# Patient Record
Sex: Male | Born: 1985 | Race: White | Hispanic: No | Marital: Single
Health system: Southern US, Community
[De-identification: ages and names within clinical notes are randomized; demographics above are authoritative.]

---

## 2010-08-10 ENCOUNTER — Inpatient Hospital Stay: Payer: Self-pay | Admitting: Internal Medicine

## 2011-06-20 ENCOUNTER — Emergency Department: Payer: Self-pay | Admitting: *Deleted

## 2013-04-24 ENCOUNTER — Emergency Department: Payer: Self-pay | Admitting: Emergency Medicine

## 2013-04-24 LAB — COMPREHENSIVE METABOLIC PANEL
Albumin: 4.1 g/dL (ref 3.4–5.0)
Bilirubin,Total: 0.5 mg/dL (ref 0.2–1.0)
Calcium, Total: 9.7 mg/dL (ref 8.5–10.1)
Chloride: 101 mmol/L (ref 98–107)
Creatinine: 1.19 mg/dL (ref 0.60–1.30)
EGFR (African American): >60
Glucose: 145 mg/dL — ABNORMAL HIGH (ref 65–99)
Osmolality: 278 (ref 275–301)
Potassium: 3.4 mmol/L — ABNORMAL LOW (ref 3.5–5.1)
Sodium: 138 mmol/L (ref 136–145)
Total Protein: 7.6 g/dL (ref 6.4–8.2)

## 2013-04-24 LAB — DRUG SCREEN, URINE
Amphetamines, Ur Screen: NEGATIVE (ref ?–1000)
Benzodiazepine, Ur Scrn: NEGATIVE (ref ?–200)
Cannabinoid 50 Ng, Ur ~~LOC~~: NEGATIVE (ref ?–50)
Cocaine Metabolite,Ur ~~LOC~~: NEGATIVE (ref ?–300)
MDMA (Ecstasy)Ur Screen: NEGATIVE (ref ?–500)
Methadone, Ur Screen: NEGATIVE (ref ?–300)
Phencyclidine (PCP) Ur S: NEGATIVE (ref ?–25)

## 2013-04-24 LAB — URINALYSIS, COMPLETE
Bilirubin,UR: NEGATIVE
Glucose,UR: >=500 mg/dL (ref 0–75)
Leukocyte Esterase: NEGATIVE
Nitrite: NEGATIVE
RBC,UR: 1908 /HPF (ref 0–5)
WBC UR: 3 /HPF (ref 0–5)

## 2013-04-24 LAB — CBC
HGB: 16 g/dL (ref 13.0–18.0)
MCV: 89 fL (ref 80–100)
Platelet: 367 10*3/uL (ref 150–440)
RBC: 5.32 10*6/uL (ref 4.40–5.90)
RDW: 12 % (ref 11.5–14.5)

## 2013-06-01 ENCOUNTER — Ambulatory Visit: Payer: Self-pay | Admitting: Urology

## 2014-10-19 NOTE — Consult Note (Signed)
PATIENT NAME:  Carl Wise, Carl Wise DATE OF BIRTH:  [birth year]  DATE OF CONSULTATION:  04/24/2013  CONSULTING PHYSICIAN:  Adah Salvageichard E. Excell Seltzerooper, MD  CHIEF COMPLAINT: Abdominal pain.   HISTORY OF PRESENT ILLNESS: This is a patient with 1 day of abdominal pain that started last night and was worsening. He had nausea and vomiting, multiple emeses, but at present his pain is completely resolved. He has never had an episode like this before. Denies fevers or chills. He has had a prior appendectomy. He points to the suprapubic region as the area of prior complaints, but he states that has completely resolved. He denies hematuria.   PAST MEDICAL HISTORY: None.   PAST SURGICAL HISTORY: Appendectomy.   ALLERGIES: None.   MEDICATIONS: None.   FAMILY HISTORY: Noncontributory.   SOCIAL HISTORY: The patient does not smoke.   REVIEW OF SYSTEMS:  Ten system review was performed and negative with the exception of that mentioned in the HPI.   PHYSICAL EXAMINATION: GENERAL: Comfortable-appearing male patient.  VITAL SIGNS: Low-grade temp of 99.6, pulse 118, now 103. Blood pressure 109/71. Pain scale of 0, 97% room air sat.  HEENT:  Shows no scleral icterus.  NECK: No palpable neck nodes.  CHEST: Clear to auscultation.  CARDIAC: Regular rate and rhythm.  ABDOMEN: Soft, nondistended, nontympanitic and essentially nontender.  EXTREMITIES: Without edema.  NEUROLOGIC: Grossly intact.  INTEGUMENT:  Shows no jaundice.   White blood cell count is slightly elevated at 11.7. H and H are 16 and 47.   CT scan is personally reviewed.   Discussed with Dr. Carollee MassedKaminski. Currently, the patient is pain free. He has no further nausea or vomiting. He has received fluids, and his heart rate has come down. I see no surgical needs at this point, and will be available if needed. Likely, this patient who is quite reliable, could be discharged to follow up with his primary care physician, should this worsen. He does  have known kidney stones found on a CT scan.    ____________________________ Adah Salvageichard E. Excell Seltzerooper, MD rec:dmm D: 04/24/2013 12:42:36 ET T: 04/24/2013 12:53:27 ET JOB#: [id number]  cc: Adah Salvageichard E. Excell Seltzerooper, MD, <Dictator> Lattie HawICHARD E COOPER MD ELECTRONICALLY SIGNED 04/24/2013 16:56

## 2014-10-19 NOTE — Consult Note (Signed)
Brief Consult Note: Diagnosis: abd pain.   Patient was seen by consultant.   Consult note dictated.   Recommend further assessment or treatment.   Discussed with Attending MD.   Comments: abd [pain and n/v resolved   HR 100 discussed with ER MD. No treatment necessary. Had prior appendectomy. known KS.  Electronic Signatures: Lattie Hawooper, Karisa Nesser E (MD)  (Signed 27-Oct-14 12:39)  Authored: Brief Consult Note   Last Updated: 27-Oct-14 12:39 by Lattie Hawooper, Eliyanah Elgersma E (MD)

## 2015-07-14 IMAGING — CT CT ABD-PELV W/ CM
1 of 2 series · 15 of 32 positions shown, 19 images · non-contrast
Comparison: none

REASON FOR EXAM: (1) abd pain - lower abd/pelvis, hematuria, tachycardia
after IV's; (2) abd pain
COMMENTS:

PROCEDURE:     CT  - CT ABDOMEN / PELVIS  W  - April 24, 2013  [DATE]
RESULT:     CT abdomen and pelvis dated 04/24/2013
TECHNIQUE: Helical 3 mm sections were obtained from the lung bases through
the pubic symphysis status post intravenous ministration of 100 mL of
[id number].

[Series 2: 3mm soft tissue · axial · 0.68mm/px · z∈[-436,+8]mm · 15 of 162 slices shown, 19 images]
[im 7/162  soft-tissue]
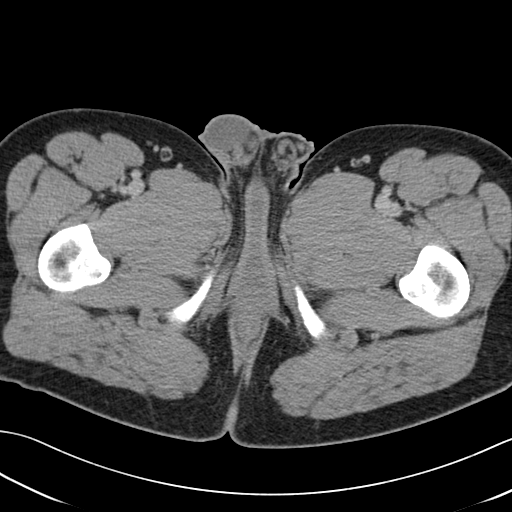
[im 7/162  bone]
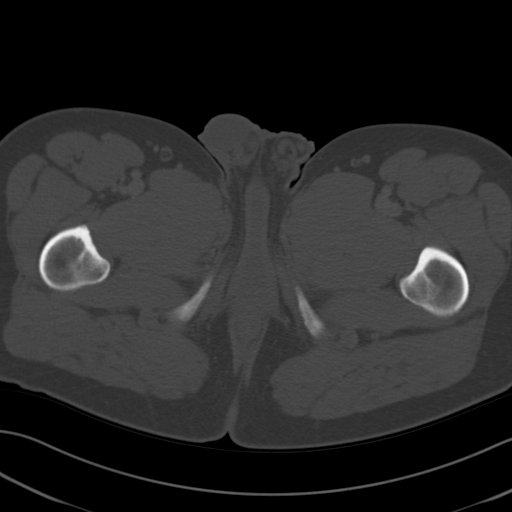
[im 20/162  soft-tissue]
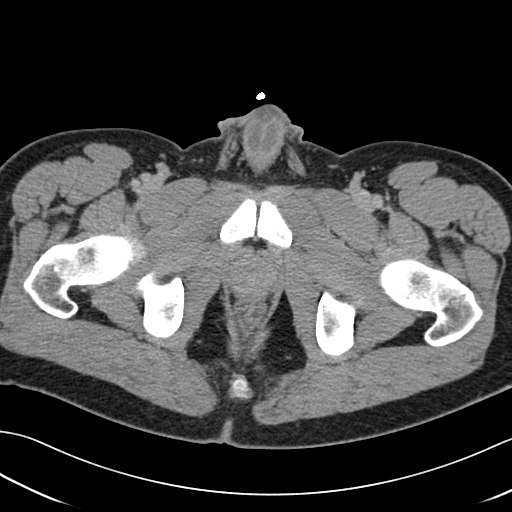
[im 33/162  soft-tissue]
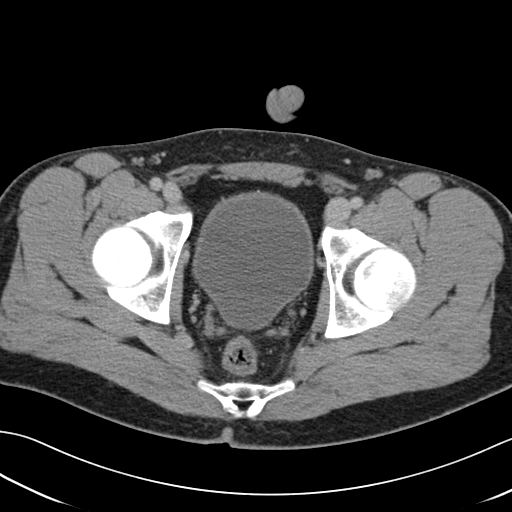
[im 46/162  soft-tissue]
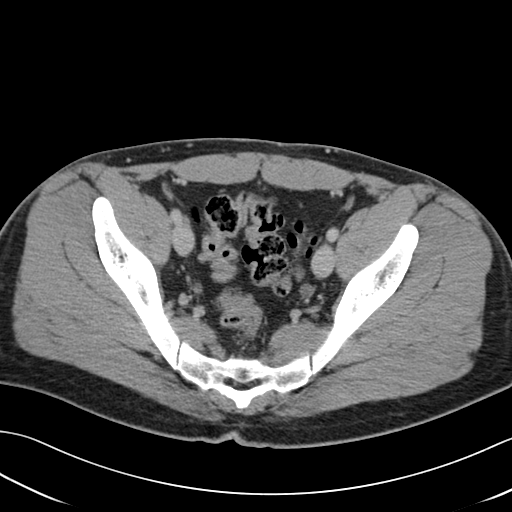
[im 58/162  soft-tissue]
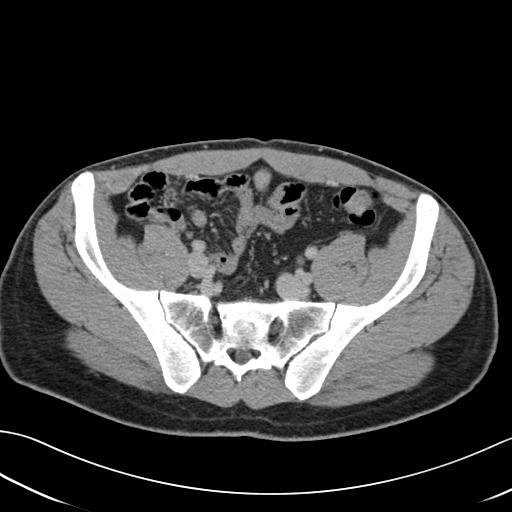
[im 71/162  soft-tissue]
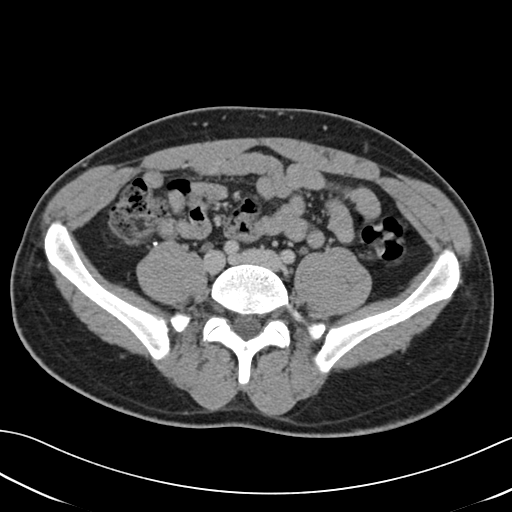
[im 84/162  soft-tissue]
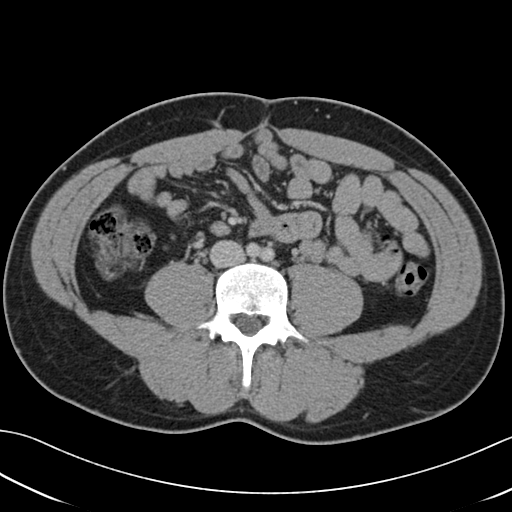
[im 91/162  soft-tissue]
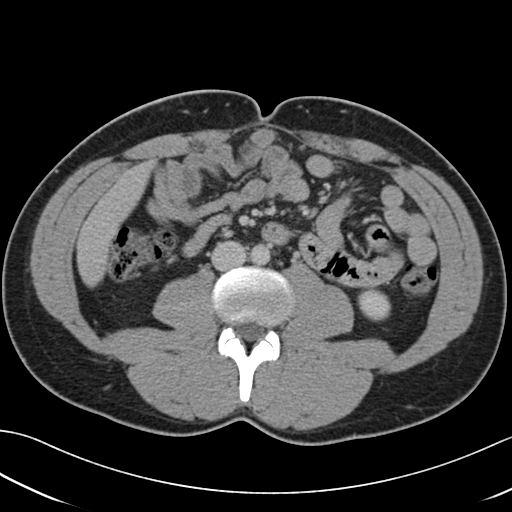
[im 104/162  soft-tissue]
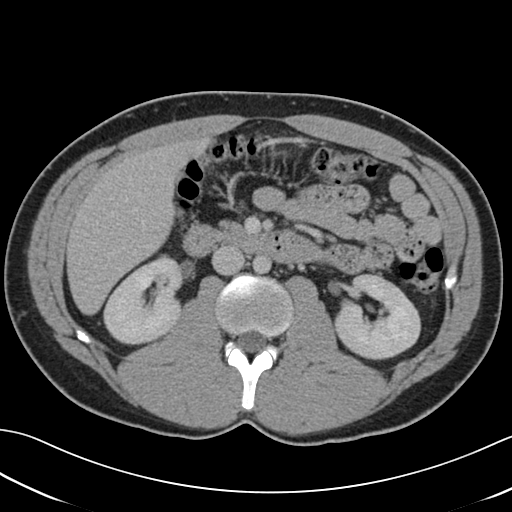
[im 104/162  bone]
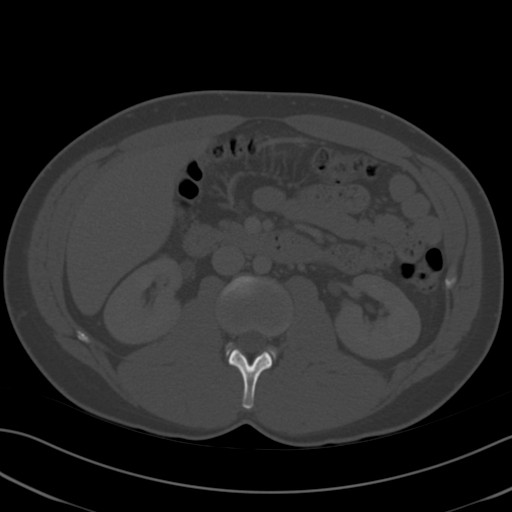
[im 116/162  soft-tissue]
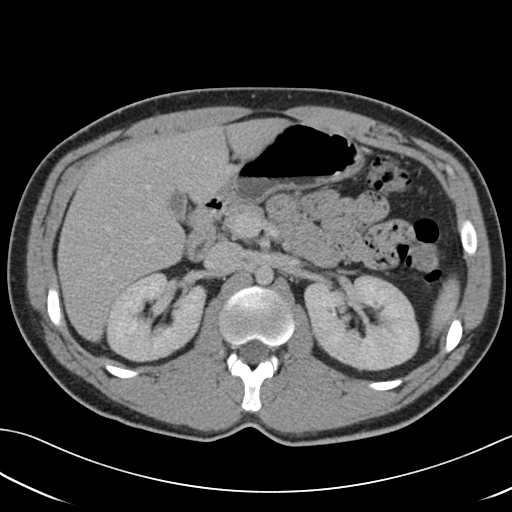
[im 129/162  soft-tissue]
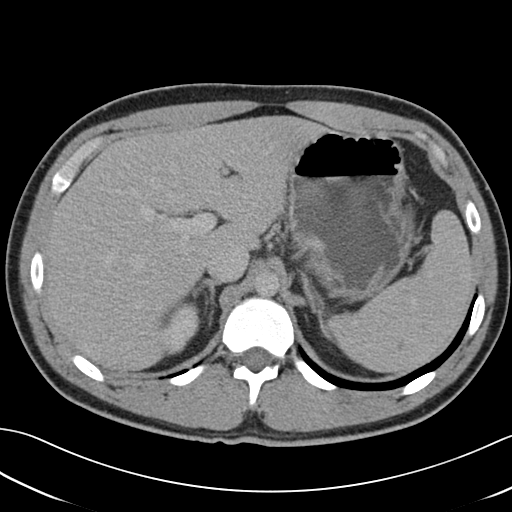
[im 136/162  lung]
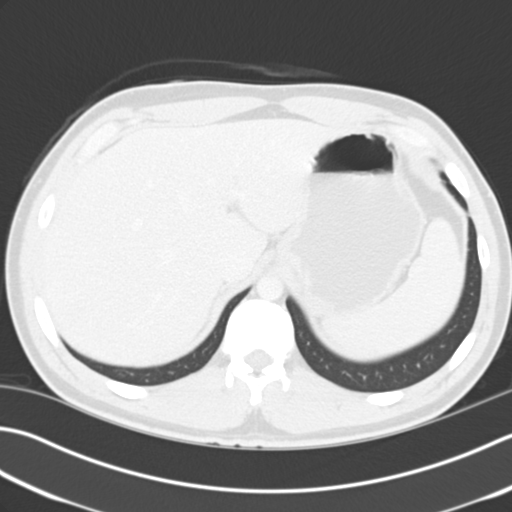
[im 142/162  soft-tissue]
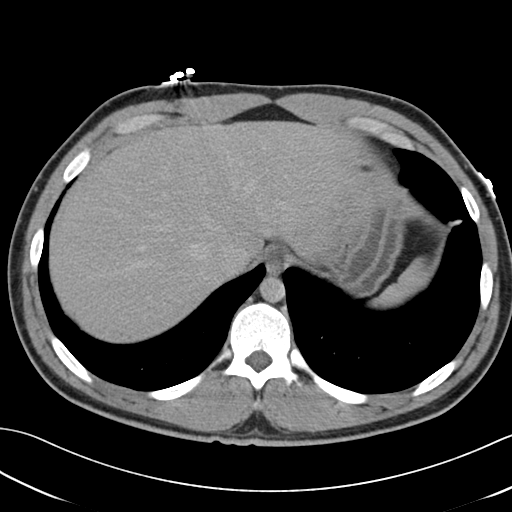
[im 142/162  lung]
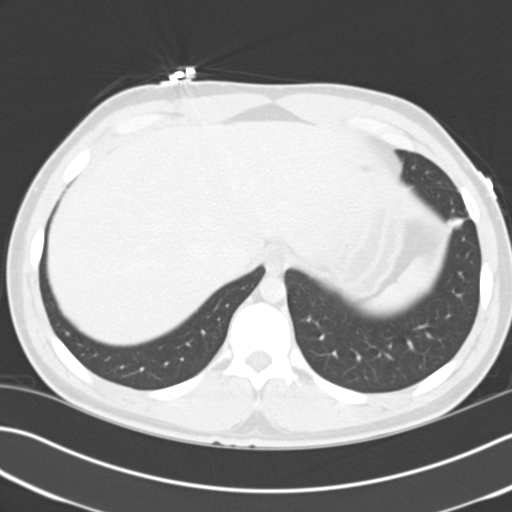
[im 149/162  lung]
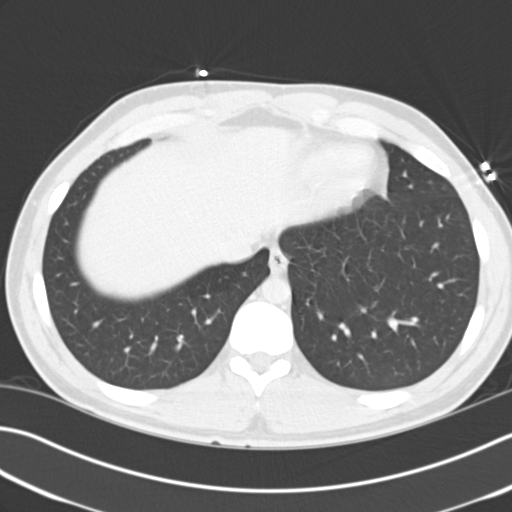
[im 155/162  soft-tissue]
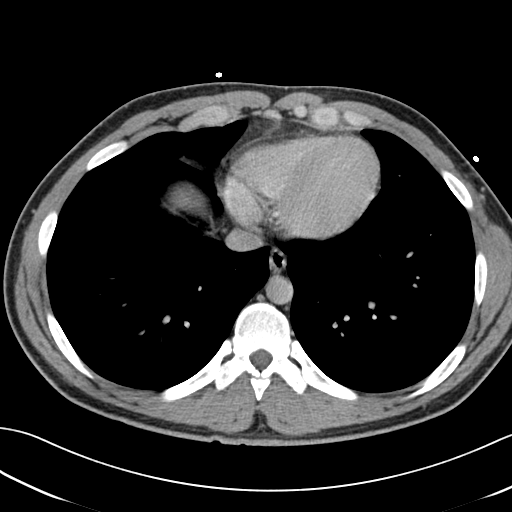
[im 155/162  lung]
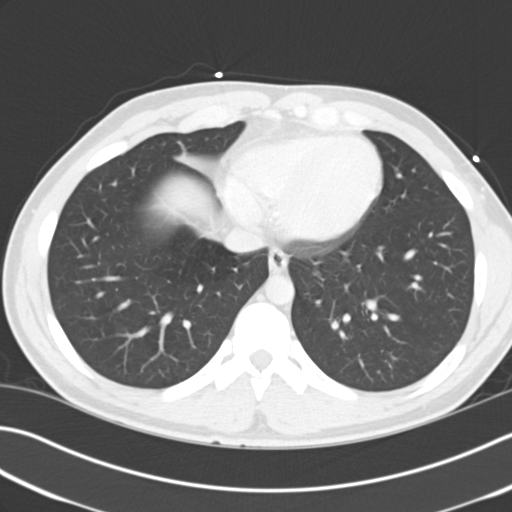

[15 of 32 positions shown; findings below may reference images not displayed]

FINDINGS: The lung bases demonstrates a 1.59 cm nodule within the anterior
lateral base of the left lower lobe image #19 lung series.

The liver, spleen, adrenals, pancreas unremarkable.

A 9.5 mm pelvic calculus is appreciated within the right kidney,
nonobstructing. A second 2 mm medullary calculus is identified also
nonobstructing. Small bilateral extrarenal pelvis is identified. There is
mild prominence of the proximal right ureter. There is no evidence of
ureterolithiasis.

Is no evidence of bowel obstruction, enteritis, colitis comment
diverticulitis common nor appendicitis. Surgical clips project in the region
of the right paracolic gutter like reflecting prior appendectomy. No
evidence of abdominal aortic aneurysm. Celiac, SMA, IMA, portal vein, SMV
are opacified. Small subcentimeter porta hepatis lymph nodes identified.

There is no evidence of abdominal or pelvic free fluid, loculated fluid
collections, masses nor pathologic sized adenopathy.
IMPRESSION: 1. Pulmonary nodule within the these of the left lower lobe. Clinical
correlation, and surveillance evaluation recommended in 4 to 6 months.
2. Nonobstructing renal calculi on the right
3. No evidence of obstructive or inflammatory abnormalities.
4. Subcentimeter porta hepatis lymph nodes questionable clinical
significance.
